# Patient Record
Sex: Male | Born: 1985 | Race: White | Hispanic: No | Marital: Married | Smoking: Never smoker
Health system: Southern US, Community
[De-identification: ages and names within clinical notes are randomized; demographics above are authoritative.]

## PROBLEM LIST (undated history)

## (undated) DIAGNOSIS — K219 Gastro-esophageal reflux disease without esophagitis: Secondary | ICD-10-CM

## (undated) HISTORY — PX: NO PAST SURGERIES: SHX2092

---
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
## 2016-07-09 ENCOUNTER — Other Ambulatory Visit: Payer: Self-pay | Admitting: Physical Medicine and Rehabilitation

## 2016-07-09 ENCOUNTER — Ambulatory Visit
Admission: RE | Admit: 2016-07-09 | Discharge: 2016-07-09 | Disposition: A | Discharge status: Home / Self Care | Payer: No Typology Code available for payment source | Source: Ambulatory Visit | Attending: Physical Medicine and Rehabilitation | Admitting: Physical Medicine and Rehabilitation

## 2016-07-09 DIAGNOSIS — Z0000 Encounter for general adult medical examination without abnormal findings: Secondary | ICD-10-CM

## 2018-02-23 ENCOUNTER — Other Ambulatory Visit: Payer: Self-pay

## 2018-02-23 ENCOUNTER — Ambulatory Visit (INDEPENDENT_AMBULATORY_CARE_PROVIDER_SITE_OTHER)
Admission: RE | Admit: 2018-02-23 | Discharge: 2018-02-23 | Disposition: A | Discharge status: Home / Self Care | Payer: Self-pay | Source: Ambulatory Visit | Attending: Acute Care | Admitting: Acute Care

## 2018-02-23 ENCOUNTER — Ambulatory Visit (INDEPENDENT_AMBULATORY_CARE_PROVIDER_SITE_OTHER): Payer: Self-pay | Admitting: Acute Care

## 2018-02-23 ENCOUNTER — Encounter: Payer: Self-pay | Admitting: Acute Care

## 2018-02-23 DIAGNOSIS — R05 Cough: Secondary | ICD-10-CM

## 2018-02-23 DIAGNOSIS — R059 Cough, unspecified: Secondary | ICD-10-CM

## 2018-02-23 DIAGNOSIS — J019 Acute sinusitis, unspecified: Secondary | ICD-10-CM | POA: Insufficient documentation

## 2018-02-23 MED ORDER — PREDNISONE 10 MG PO TABS: ORAL_TABLET | ORAL | 0 refills | Status: DC

## 2018-02-23 MED ORDER — AMOXICILLIN-POT CLAVULANATE 875-125 MG PO TABS: 1.0000 | ORAL_TABLET | Freq: Two times a day (BID) | ORAL | 0 refills | Status: DC

## 2018-02-23 MED ORDER — HYDROCODONE-HOMATROPINE 5-1.5 MG/5ML PO SYRP: 5.0000 mL | ORAL_SOLUTION | Freq: Four times a day (QID) | ORAL | 0 refills | Status: DC | PRN

## 2018-02-23 NOTE — Patient Instructions (Addendum)
CXR today Augmentin 875 mg po twice daily x 7 days Prednisone taper; 10 mg tablets: 4 tabs x 2 days, 3 tabs x 2 days, 2 tabs x 2 days 1 tab x 2 days then stop. Zyrtec each day for allergies Delsym every 12 hours for cough Sips of water instead of throat clearing Sugar free hard candies for throat soothing Consider Advil sinus for nasal congestion  Nasal saline mist as needed for nasal congestion. Mucinex 1200 mg daily with a full glass of water Hydromet 5 cc's at bedtime for rest. Don't drive if you are sleepy Follow up as needed. Please contact office for sooner follow up if symptoms do not improve or worsen or seek emergency care

## 2018-02-23 NOTE — Assessment & Plan Note (Signed)
Acute bronchitis / Sinusitis Plan: CXR today Augmentin 875 mg po twice daily x 7 days Prednisone taper; 10 mg tablets: 4 tabs x 2 days, 3 tabs x 2 days, 2 tabs x 2 days 1 tab x 2 days then stop. Zyrtec each day for allergies Delsym every 12 hours for cough Sips of water instead of throat clearing Sugar free hard candies for throat soothing Consider Advil sinus for nasal congestion  Nasal saline mist as needed for nasal congestion. Mucinex 1200 mg daily with a full glass of water Hydromet 5 cc's at bedtime for rest. Don't drive if you are sleepy Follow up as needed. Please contact office for sooner follow up if symptoms do not improve or worsen or seek emergency care

## 2018-02-23 NOTE — Progress Notes (Signed)
History of Present Illness Austin Bautista is a 32 y.o. male former smoker with no significant PMH . He is a new patient to the clinic, here for an acute OV for suspected pneumonia.   02/23/2018  Acute OV: Pt. Presents for  Acute visit. He has new nasal congestion and productive cough. He has been sick x 3 days. He denies any fever. He has chest tightness and has not complained of SOB. He has had walking pneumonia in the past, and states he feels like he did when he was diagnosed with walking pneumonia. Secretions are greenish brown. He also has nasal congestion that is green.He endorses post nasal gtt. 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   Test Results: CXR 02/22/2018>> Heart and mediastinal contours are within normal limits. No focal opacities or effusions. No acute bony abnormality.  No flowsheet data found.  No flowsheet data found.  BNP No results found for: BNP  ProBNP No results found for: PROBNP  PFT No results found for: FEV1PRE, FEV1POST, FVCPRE, FVCPOST, TLC, DLCOUNC, PREFEV1FVCRT, PSTFEV1FVCRT  Dg Chest 2 View  Result Date: 02/23/2018 CLINICAL DATA:  Cough, congestion, chest pain EXAM: CHEST - 2 VIEW COMPARISON:  07/09/2016 FINDINGS: Heart and mediastinal contours are within normal limits. No focal opacities or effusions. No acute bony abnormality. IMPRESSION: No active cardiopulmonary disease. Electronically Signed   By: Charlett NoseKevin  Dover M.D.   On: 02/23/2018 11:28     Past medical hx History reviewed. No pertinent past medical history.   Social History   Tobacco Use  . Smoking status: Never Smoker  . Smokeless tobacco: Current User    Types: Chew  Substance Use Topics  . Alcohol use: Not on file  . Drug use: Not on file    Mr.Clements reports that he has never smoked. His smokeless tobacco use includes chew.  Tobacco Cessation: Pt. Is an occasional chewing tobacco user.   Past surgical hx, Family hx, Social hx all  reviewed.  Current Outpatient Medications on File Prior to Visit  Medication Sig  . pantoprazole (PROTONIX) 40 MG tablet Take 40 mg by mouth daily.   No current facility-administered medications on file prior to visit.      Allergies  Allergen Reactions  . Minocycline Hives    Review Of Systems:  Constitutional:   No  weight loss, night sweats,  Fevers, chills, fatigue, or  lassitude.  HEENT:   No headaches,  Difficulty swallowing,  Tooth/dental problems, or  Sore throat,                No sneezing, itching, ear ache, + nasal congestion, + post nasal drip,   CV:  No chest pain,  Orthopnea, PND, swelling in lower extremities, anasarca, dizziness, palpitations, syncope.   GI  No heartburn, indigestion, abdominal pain, nausea, vomiting, diarrhea, change in bowel habits, loss of appetite, bloody stools.   Resp: No shortness of breath with exertion or at rest.  + excess mucus, + productive cough,  No non-productive cough,  No coughing up of blood.  + change in color of mucus.  No wheezing.  No chest wall deformity  Skin: no rash or lesions.  GU: no dysuria, change in color of urine, no urgency or frequency.  No flank pain, no hematuria   MS:  No joint pain or swelling.  No decreased range of motion.  No back pain.  Psych:  No change in mood or affect. No depression or anxiety.  No memory loss.   Vital Signs BP 122/88 (BP Location: Left Arm, Cuff Size: Normal)   Pulse 94   Wt 278 lb (126.1 kg)   SpO2 97%    Physical Exam:  General- No distress,  A&Ox3, pleasant ENT: No sinus tenderness, TM clear, edematous  nasal mucosa, no oral exudate,+ post nasal drip, no LAN Cardiac: S1, S2, regular rate and rhythm, no murmur Chest: No wheeze/ rales/ dullness; no accessory muscle use, no nasal flaring, no sternal retractions Abd.: Soft Non-tender, ND, BS + Ext: No clubbing cyanosis, edema Neuro:  normal strength, Cranial nerves intact Skin: No rashes, warm and dry Psych: normal mood  and behavior   Assessment/Plan  No problem-specific Assessment & Plan notes found for this encounter.    Bevelyn Ngo, NP 02/23/2018  1:46 PM

## 2018-12-24 ENCOUNTER — Other Ambulatory Visit: Payer: Self-pay

## 2018-12-24 ENCOUNTER — Ambulatory Visit (HOSPITAL_COMMUNITY)
Admission: EM | Admit: 2018-12-24 | Discharge: 2018-12-24 | Disposition: A | Discharge status: Home / Self Care | Payer: 59 | Attending: Family Medicine | Admitting: Family Medicine

## 2018-12-24 ENCOUNTER — Encounter (HOSPITAL_COMMUNITY): Payer: Self-pay | Admitting: Emergency Medicine

## 2018-12-24 ENCOUNTER — Ambulatory Visit (INDEPENDENT_AMBULATORY_CARE_PROVIDER_SITE_OTHER): Payer: 59

## 2018-12-24 DIAGNOSIS — J189 Pneumonia, unspecified organism: Secondary | ICD-10-CM

## 2018-12-24 DIAGNOSIS — R0602 Shortness of breath: Secondary | ICD-10-CM | POA: Diagnosis not present

## 2018-12-24 DIAGNOSIS — R05 Cough: Secondary | ICD-10-CM | POA: Diagnosis not present

## 2018-12-24 HISTORY — DX: Gastro-esophageal reflux disease without esophagitis: K21.9

## 2018-12-24 MED ORDER — CEFDINIR 300 MG PO CAPS: 300.0000 mg | ORAL_CAPSULE | Freq: Two times a day (BID) | ORAL | 0 refills | Status: DC

## 2018-12-24 MED ORDER — CEFTRIAXONE SODIUM 250 MG IJ SOLR
INTRAMUSCULAR | Status: AC
  Filled 2018-12-24: qty 250

## 2018-12-24 MED ORDER — HYDROCODONE-HOMATROPINE 5-1.5 MG/5ML PO SYRP: 5.0000 mL | ORAL_SOLUTION | Freq: Four times a day (QID) | ORAL | 0 refills | Status: DC | PRN

## 2018-12-24 MED ORDER — ACETAMINOPHEN 325 MG PO TABS
650.0000 mg | ORAL_TABLET | Freq: Once | ORAL | Status: AC
  Administered 2018-12-24: 650 mg via ORAL

## 2018-12-24 MED ORDER — ACETAMINOPHEN 325 MG PO TABS
ORAL_TABLET | ORAL | Status: AC
  Filled 2018-12-24: qty 2

## 2018-12-24 MED ORDER — CEFTRIAXONE SODIUM 250 MG IJ SOLR
250.0000 mg | Freq: Once | INTRAMUSCULAR | Status: AC
  Administered 2018-12-24: 250 mg via INTRAMUSCULAR

## 2018-12-24 NOTE — ED Provider Notes (Addendum)
[facility]    CSN: [id number] Arrival date & time: 12/24/18  1544     History   Chief Complaint Chief Complaint  Patient presents with  . Cough    HPI Austin RUMPEL is a 33 y.o. male.   Patient has a history of cough for 2 to 3 days with fever.  Cough is productive of yellow sputum.  Denies chills.  Denies myalgias sore throat headache HPI  Past Medical History:  Diagnosis Date  . GERD (gastroesophageal reflux disease)     Patient Active Problem List   Diagnosis Date Noted  . Sinusitis, acute 02/23/2018    History reviewed. No pertinent surgical history.     Home Medications    Prior to Admission medications   Medication Sig Start Date End Date Taking? Authorizing Provider  pantoprazole (PROTONIX) 40 MG tablet Take 40 mg by mouth daily.   Yes [provider]    Family History History reviewed. No pertinent family history.  Social History Social History   Tobacco Use  . Smoking status: Never Smoker  . Smokeless tobacco: Current User    Types: Chew  Substance Use Topics  . Alcohol use: Yes    Comment: Occ  . Drug use: Never     Allergies   Minocycline   Review of Systems Review of Systems  Constitutional: Positive for fever.  HENT: Positive for congestion and postnasal drip.   Respiratory: Positive for cough.   All other systems reviewed and are negative.    Physical Exam Triage Vital Signs ED Triage Vitals  Enc Vitals Group     BP 12/24/18 1627 132/63     Pulse Rate 12/24/18 1627 (!) 124     Resp 12/24/18 1627 18     Temp 12/24/18 1627 (!) 104.4 F (40.2 C)     Temp Source 12/24/18 1627 Temporal     SpO2 12/24/18 1627 95 %     Weight --      Height --      Head Circumference --      Peak Flow --      Pain Score 12/24/18 1626 8     Pain Loc --      Pain Edu? --      Excl. in GC? --    No data found.  Updated Vital Signs BP 132/63 (BP Location: Right Arm)   Pulse (!) 124   Temp (!) 104.4 F (40.2 C)  (Temporal)   Resp 18   SpO2 95%   Visual Acuity Right Eye Distance:   Left Eye Distance:   Bilateral Distance:    Right Eye Near:   Left Eye Near:    Bilateral Near:     Physical exam: HEENT -negative Chest lungs are clear to percussion and auscultation  Heart regular and tachycardic likely secondary to fever  Chest x-ray is consistent with pneumonia  UC Treatments / Results  Labs (all labs ordered are listed, but only abnormal results are displayed) Labs Reviewed - No data to display  EKG None  Radiology No results found.  Procedures Procedures (including critical care time)  Medications Ordered in UC Medications  acetaminophen (TYLENOL) tablet 650 mg (650 mg Oral Given 12/24/18 1637)    Initial Impression / Assessment and Plan / UC Course  I have reviewed the triage vital signs and the nursing notes.  Pertinent labs & imaging results that were available during my care of the patient were reviewed by me and considered in  my medical decision making (see chart for details).     Pneumonia per radiology over read.  Will give Rocephin here and follow-up with Omnicef Final Clinical Impressions(s) / UC Diagnoses   Final diagnoses:  None   Discharge Instructions   None    ED Prescriptions    None     Controlled Substance Prescriptions [place] Controlled Substance Registry consulted? Yes, I have consulted the [place] Controlled Substances Registry for this patient, and feel the risk/benefit ratio today is favorable for proceeding with this prescription for a controlled substance.   Frederica Kuster, MD 12/24/18 1747    Frederica Kuster, MD 12/24/18 [phone]

## 2018-12-24 NOTE — ED Triage Notes (Signed)
The patient presented to the [facility] with a complaint of a cough and fever x 3 days.

## 2018-12-25 ENCOUNTER — Other Ambulatory Visit: Payer: Self-pay | Admitting: Acute Care

## 2018-12-25 DIAGNOSIS — J440 Chronic obstructive pulmonary disease with acute lower respiratory infection: Principal | ICD-10-CM

## 2018-12-25 DIAGNOSIS — J209 Acute bronchitis, unspecified: Secondary | ICD-10-CM

## 2018-12-25 MED ORDER — HYDROCODONE-HOMATROPINE 5-1.5 MG/5ML PO SYRP: 5.0000 mL | ORAL_SOLUTION | Freq: Four times a day (QID) | ORAL | 0 refills | Status: DC | PRN

## 2019-08-08 ENCOUNTER — Other Ambulatory Visit: Payer: Self-pay

## 2019-08-08 ENCOUNTER — Encounter (HOSPITAL_COMMUNITY): Payer: Self-pay

## 2019-08-08 ENCOUNTER — Ambulatory Visit (HOSPITAL_COMMUNITY)
Admission: EM | Admit: 2019-08-08 | Discharge: 2019-08-08 | Disposition: A | Discharge status: Home / Self Care | Payer: 59 | Attending: Internal Medicine | Admitting: Internal Medicine

## 2019-08-08 DIAGNOSIS — R000 Tachycardia, unspecified: Secondary | ICD-10-CM | POA: Diagnosis not present

## 2019-08-08 DIAGNOSIS — Z20828 Contact with and (suspected) exposure to other viral communicable diseases: Secondary | ICD-10-CM | POA: Diagnosis not present

## 2019-08-08 DIAGNOSIS — J22 Unspecified acute lower respiratory infection: Secondary | ICD-10-CM | POA: Diagnosis not present

## 2019-08-08 DIAGNOSIS — R062 Wheezing: Secondary | ICD-10-CM | POA: Diagnosis not present

## 2019-08-08 MED ORDER — BENZONATATE 100 MG PO CAPS: 100.0000 mg | ORAL_CAPSULE | Freq: Three times a day (TID) | ORAL | 0 refills | Status: DC

## 2019-08-08 MED ORDER — AMOXICILLIN-POT CLAVULANATE 875-125 MG PO TABS: 1.0000 | ORAL_TABLET | Freq: Two times a day (BID) | ORAL | 0 refills | Status: DC

## 2019-08-08 MED ORDER — HYDROCODONE-HOMATROPINE 5-1.5 MG/5ML PO SYRP: 5.0000 mL | ORAL_SOLUTION | Freq: Four times a day (QID) | ORAL | 0 refills | Status: DC | PRN

## 2019-08-08 MED ORDER — ALBUTEROL SULFATE HFA 108 (90 BASE) MCG/ACT IN AERS: 1.0000 | INHALATION_SPRAY | Freq: Four times a day (QID) | RESPIRATORY_TRACT | 0 refills | Status: DC | PRN

## 2019-08-08 NOTE — ED Provider Notes (Signed)
[facility]    CSN: [id number] Arrival date & time: 08/08/19  [id number]      History   Chief Complaint Chief Complaint  Patient presents with   Cough    HPI Austin Bautista is a 33 y.o. male.   Patient is a 33 year old male past medical history of GERD, sinusitis.  He presents today with productive cough worsening over the last 2 weeks.  The cough is been keeping him up at night.  He has had low-grade fever.  Temperature here today 100.3.  No known COVID exposures.  He has been using over-the-counter Mucinex, NyQuil and DayQuil for symptoms.  Denies any chest pain, shortness of breath, nasal congestion, rhinorrhea, sore throat or ear pain.  No known COVID exposures.  ROS per HPI      Past Medical History:  Diagnosis Date   GERD (gastroesophageal reflux disease)     Patient Active Problem List   Diagnosis Date Noted   Sinusitis, acute 02/23/2018    History reviewed. No pertinent surgical history.     Home Medications    Prior to Admission medications   Medication Sig Start Date End Date Taking? Authorizing Provider  albuterol (VENTOLIN HFA) 108 (90 Base) MCG/ACT inhaler Inhale 1-2 puffs into the lungs every 6 (six) hours as needed for wheezing or shortness of breath. 08/08/19   Dahlia ByesBast, Bethel Gaglio A, NP  amoxicillin-clavulanate (AUGMENTIN) 875-125 MG tablet Take 1 tablet by mouth every 12 (twelve) hours. 08/08/19   Tashanti Dalporto, Gloris Manchesterraci A, NP  benzonatate (TESSALON) 100 MG capsule Take 1 capsule (100 mg total) by mouth every 8 (eight) hours. 08/08/19   Atlas Crossland, Gloris Manchesterraci A, NP  HYDROcodone-homatropine (HYCODAN) 5-1.5 MG/5ML syrup Take 5 mLs by mouth every 6 (six) hours as needed for cough. 08/08/19   Myson Levi, Gloris Manchesterraci A, NP  pantoprazole (PROTONIX) 40 MG tablet Take 40 mg by mouth daily.    [provider]    Family History History reviewed. No pertinent family history.  Social History Social History   Tobacco Use   Smoking status: Never Smoker   Smokeless tobacco: Current User     Types: Chew  Substance Use Topics   Alcohol use: Yes    Comment: Occ   Drug use: Never     Allergies   Minocycline   Review of Systems Review of Systems   Physical Exam Triage Vital Signs ED Triage Vitals [08/08/19 0825]  Enc Vitals Group     BP 134/84     Pulse Rate (!) 107     Resp 18     Temp 100.3 F (37.9 C)     Temp Source Temporal     SpO2 97 %     Weight 270 lb (122.5 kg)     Height      Head Circumference      Peak Flow      Pain Score 8     Pain Loc      Pain Edu?      Excl. in GC?    No data found.  Updated Vital Signs BP 134/84    Pulse (!) 107    Temp 100.3 F (37.9 C) (Temporal)    Resp 18    Wt 270 lb (122.5 kg)    SpO2 97%   Visual Acuity Right Eye Distance:   Left Eye Distance:   Bilateral Distance:    Right Eye Near:   Left Eye Near:    Bilateral Near:     Physical Exam  Vitals signs and nursing note reviewed.  Constitutional:      General: He is not in acute distress.    Appearance: Normal appearance. He is not ill-appearing, toxic-appearing or diaphoretic.  HENT:     Head: Normocephalic and atraumatic.     Right Ear: Tympanic membrane and ear canal normal.     Left Ear: Tympanic membrane and ear canal normal.     Nose: Nose normal.     Mouth/Throat:     Pharynx: Oropharynx is clear.  Eyes:     Conjunctiva/sclera: Conjunctivae normal.  Neck:     Musculoskeletal: Normal range of motion.  Cardiovascular:     Rate and Rhythm: Tachycardia present.     Heart sounds: Normal heart sounds.  Pulmonary:     Effort: Pulmonary effort is normal.     Breath sounds: Wheezing and rales present. No rhonchi.  Musculoskeletal: Normal range of motion.  Skin:    General: Skin is warm and dry.  Neurological:     Mental Status: He is alert.  Psychiatric:        Mood and Affect: Mood normal.      UC Treatments / Results  Labs (all labs ordered are listed, but only abnormal results are displayed) Labs Reviewed  NOVEL CORONAVIRUS,  NAA (HOSP ORDER, SEND-OUT TO REF LAB; TAT 18-24 HRS)    EKG   Radiology No results found.  Procedures Procedures (including critical care time)  Medications Ordered in UC Medications - No data to display  Initial Impression / Assessment and Plan / UC Course  I have reviewed the triage vital signs and the nursing notes.  Pertinent labs & imaging results that were available during my care of the patient were reviewed by me and considered in my medical decision making (see chart for details).     Lower respiratory tract infection-treating with Augmentin twice a day for 7 days for infection.  Albuterol inhaler to use as needed for cough, wheezing or shortness of breath Tessalon Perles for cough during the day and hydrocodone cough syrup for night COVID test pending Final Clinical Impressions(s) / UC Diagnoses   Final diagnoses:  Lower respiratory tract infection     Discharge Instructions     Treating you for pneumonia Take medication as prescribed. Your COVID test is pending and we will call with any positive results Follow up as needed for continued or worsening symptoms     ED Prescriptions    Medication Sig Dispense Auth. Provider   amoxicillin-clavulanate (AUGMENTIN) 875-125 MG tablet Take 1 tablet by mouth every 12 (twelve) hours. 14 tablet Daniell Mancinas A, NP   HYDROcodone-homatropine (HYCODAN) 5-1.5 MG/5ML syrup Take 5 mLs by mouth every 6 (six) hours as needed for cough. 120 mL Braeden Kennan A, NP   benzonatate (TESSALON) 100 MG capsule Take 1 capsule (100 mg total) by mouth every 8 (eight) hours. 21 capsule Takeru Bose A, NP   albuterol (VENTOLIN HFA) 108 (90 Base) MCG/ACT inhaler Inhale 1-2 puffs into the lungs every 6 (six) hours as needed for wheezing or shortness of breath. 18 g Loura Halt A, NP     Controlled Substance Prescriptions [place] Controlled Substance Registry consulted? Not Applicable   Orvan July, NP 08/08/19 [phone]

## 2019-08-08 NOTE — ED Triage Notes (Signed)
Pt states he has a deep cough. X 2 weeks

## 2019-08-08 NOTE — Discharge Instructions (Addendum)
Treating you for pneumonia Take medication as prescribed. Your COVID test is pending and we will call with any positive results Follow up as needed for continued or worsening symptoms

## 2019-08-10 LAB — NOVEL CORONAVIRUS, NAA (HOSP ORDER, SEND-OUT TO REF LAB; TAT 18-24 HRS): SARS-CoV-2, NAA: NOT DETECTED

## 2019-08-13 ENCOUNTER — Encounter (HOSPITAL_COMMUNITY): Payer: Self-pay

## 2020-04-01 ENCOUNTER — Ambulatory Visit: Payer: 59 | Admitting: Adult Health

## 2021-03-02 ENCOUNTER — Ambulatory Visit: Payer: 59 | Admitting: Emergency Medicine

## 2021-03-02 ENCOUNTER — Encounter: Payer: Self-pay | Admitting: Emergency Medicine

## 2021-03-02 ENCOUNTER — Other Ambulatory Visit: Payer: Self-pay

## 2021-03-02 DIAGNOSIS — G4733 Obstructive sleep apnea (adult) (pediatric): Secondary | ICD-10-CM | POA: Insufficient documentation

## 2021-03-02 DIAGNOSIS — G4719 Other hypersomnia: Secondary | ICD-10-CM | POA: Diagnosis not present

## 2021-03-02 NOTE — Assessment & Plan Note (Signed)
He almost certainly has obstructive sleep apnea based on history.  He needs a sleep study, treatment that I would like to at least begin with CPAP.  I can arrange for an auto titration device.  He has a beard so we could start with nasal pillows.  He will probably need a chinstrap because he sleeps with his mouth open.  We briefly discussed other possible treatments including dental device, surgery, hypoglossal nerve stimulator.  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  We will have to pursue this further once we have the obstructive sleep apnea managed.

## 2021-03-02 NOTE — Progress Notes (Signed)
Subjective:    Patient ID: Austin Bautista, male    DOB: 1986/10/13, 35 y.o.   MRN: [id number]  HPI Austin Bautista is a pleasant 35 year old gentleman with a minimal prior tobacco history and little other PMH except for GERD.   He is here today to discuss probable OSA. He falls asleep easily, even unintentionally at work. Decreased energy and a lot of daily fatigue. He works swing shifts and has a lot of sleep / shift variability through work.  His wife has heard snoring and has seen apneas.  He has put on ~100 lbs over 10 yrs.  Epworth = 16   Review of Systems As per HPi  Past Medical History:  Diagnosis Date  . GERD (gastroesophageal reflux disease)      No family history on file.   Social History   Socioeconomic History  . Marital status: Married    Spouse name: Not on file  . Number of children: Not on file  . Years of education: Not on file  . Highest education level: Not on file  Occupational History  . Not on file  Tobacco Use  . Smoking status: Never Smoker  . Smokeless tobacco: Current User    Types: Chew  Vaping Use  . Vaping Use: Former  Substance and Sexual Activity  . Alcohol use: Yes    Comment: Occ  . Drug use: Never  . Sexual activity: Not on file  Other Topics Concern  . Not on file  Social History Narrative  . Not on file   Social Determinants of Health   Financial Resource Strain: Not on file  Food Insecurity: Not on file  Transportation Needs: Not on file  Physical Activity: Not on file  Stress: Not on file  Social Connections: Not on file  Intimate Partner Violence: Not on file    Works at [employer].  No inhaled exposures.    Allergies  Allergen Reactions  . Minocycline Hives     Outpatient Medications Prior to Visit  Medication Sig Dispense Refill  . pantoprazole (PROTONIX) 40 MG tablet Take 40 mg by mouth daily.    Marland Kitchen albuterol (VENTOLIN HFA) 108 (90 Base) MCG/ACT inhaler Inhale 1-2 puffs into the lungs every 6 (six) hours as needed for  wheezing or shortness of breath. 18 g 0  . benzonatate (TESSALON) 100 MG capsule Take 1 capsule (100 mg total) by mouth every 8 (eight) hours. 21 capsule 0  . amoxicillin-clavulanate (AUGMENTIN) 875-125 MG tablet Take 1 tablet by mouth every 12 (twelve) hours. 14 tablet 0  . HYDROcodone-homatropine (HYCODAN) 5-1.5 MG/5ML syrup Take 5 mLs by mouth every 6 (six) hours as needed for cough. 120 mL 0   No facility-administered medications prior to visit.         Objective:   Physical Exam  Vitals:   03/02/21 1525  BP: 124/78  Pulse: 91  SpO2: 97%   Gen: Pleasant, well-nourished, in no distress,  normal affect  ENT: No lesions,  mouth clear,  oropharynx clear, no postnasal drip  Neck: No JVD, no stridor  Lungs: No use of accessory muscles, no crackles or wheezing on normal respiration, no wheeze on forced expiration  Cardiovascular: RRR, heart sounds normal, no murmur or gallops, no peripheral edema  Musculoskeletal: No deformities, no cyanosis or clubbing  Neuro: alert, awake, non focal  Skin: Warm, no lesions or rash     Assessment & Plan:  Excessive daytime sleepiness He almost certainly has obstructive sleep apnea based on  history.  He needs a sleep study, treatment that I would like to at least begin with CPAP.  I can arrange for an auto titration device.  He has a beard so we could start with nasal pillows.  He will probably need a chinstrap because he sleeps with his mouth open.  We briefly discussed other possible treatments including dental device, surgery, hypoglossal nerve stimulator.  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  We will have to pursue this further once we have the obstructive sleep apnea managed.   Levy Pupa, MD, PhD 03/02/2021, 3:58 PM [facility] [phone] or if no answer before 7:00PM call [phone] For any issues after 7:00PM please call eLink  [phone]

## 2021-03-02 NOTE — Addendum Note (Signed)
Addended by: Dorisann Frames R on: 03/02/2021 04:08 PM   Modules accepted: Orders

## 2021-03-02 NOTE — Patient Instructions (Signed)
We will arrange for a home sleep study Depending on your sleep study results we will likely arrange for an auto titration CPAP machine. Follow with Dr. Delton Coombes in 2 to 3 months so that we can review your progress on CPAP.

## 2021-03-06 ENCOUNTER — Ambulatory Visit: Payer: 59

## 2021-03-06 ENCOUNTER — Other Ambulatory Visit: Payer: Self-pay

## 2021-03-06 DIAGNOSIS — G4719 Other hypersomnia: Secondary | ICD-10-CM

## 2021-03-06 DIAGNOSIS — G4733 Obstructive sleep apnea (adult) (pediatric): Secondary | ICD-10-CM | POA: Diagnosis not present

## 2021-03-10 DIAGNOSIS — G4733 Obstructive sleep apnea (adult) (pediatric): Secondary | ICD-10-CM

## 2021-03-23 ENCOUNTER — Other Ambulatory Visit: Payer: Self-pay | Admitting: Emergency Medicine

## 2021-03-23 DIAGNOSIS — G4733 Obstructive sleep apnea (adult) (pediatric): Secondary | ICD-10-CM

## 2021-03-23 NOTE — Progress Notes (Signed)
Patient's home sleep study reviewed >> shows severe OSA, AHI 63/ h ( 78/h in REM) associated with desaturations.   Will order auto-set CPAp 10-20 cm H2O. May need home ONO after CPAP established to insure no supplemental O2 needed to be bled in.

## 2021-05-26 ENCOUNTER — Other Ambulatory Visit: Payer: Self-pay

## 2021-05-26 ENCOUNTER — Ambulatory Visit
Admission: RE | Admit: 2021-05-26 | Discharge: 2021-05-26 | Disposition: A | Discharge status: Home / Self Care | Payer: 59 | Source: Ambulatory Visit | Attending: Emergency Medicine | Admitting: Emergency Medicine

## 2021-05-26 ENCOUNTER — Ambulatory Visit (INDEPENDENT_AMBULATORY_CARE_PROVIDER_SITE_OTHER): Payer: 59

## 2021-05-26 VITALS — BP 136/93 | HR 92 | Temp 98.3°F | Resp 20

## 2021-05-26 DIAGNOSIS — Z1152 Encounter for screening for COVID-19: Secondary | ICD-10-CM

## 2021-05-26 DIAGNOSIS — R059 Cough, unspecified: Secondary | ICD-10-CM

## 2021-05-26 DIAGNOSIS — J209 Acute bronchitis, unspecified: Secondary | ICD-10-CM

## 2021-05-26 MED ORDER — AZITHROMYCIN 250 MG PO TABS: 250.0000 mg | ORAL_TABLET | Freq: Every day | ORAL | 0 refills | Status: DC

## 2021-05-26 MED ORDER — BENZONATATE 100 MG PO CAPS: 100.0000 mg | ORAL_CAPSULE | Freq: Three times a day (TID) | ORAL | 0 refills | Status: DC | PRN

## 2021-05-26 NOTE — ED Triage Notes (Addendum)
Pt presents today with c/o of productive cough x 5 days. Denies fever.   He has taken two home Covid test, both negative, last 6/26.

## 2021-05-26 NOTE — Discharge Instructions (Addendum)
Take the Zithromax as directed.  Take the Tessalon as needed for cough.  Schedule a follow-up appointment with your PCP in 2 weeks for a recheck of your lungs and to discuss the x-ray findings.  Sooner if your symptoms are not improving.

## 2021-05-26 NOTE — ED Provider Notes (Signed)
Renaldo Fiddler    CSN: [id number] Arrival date & time: 05/26/21  1238      History   Chief Complaint Chief Complaint  Patient presents with   Cough    HPI Austin Bautista is a 35 y.o. male.  Patient presents with productive cough and congestion x 5 days.  He states it has gotten worse in the last 2 days.  He denies fever, chills, sore throat, shortness of breath, vomiting, diarrhea, or other symptoms.  He reports history of pneumonia 2 years ago.  Treatment at home with Mucinex, DayQuil, NyQuil.  He also reports negative COVID test at home.  The history is provided by the patient and medical records.   Past Medical History:  Diagnosis Date   GERD (gastroesophageal reflux disease)     Patient Active Problem List   Diagnosis Date Noted   Excessive daytime sleepiness 03/02/2021   Sinusitis, acute 02/23/2018    History reviewed. No pertinent surgical history.     Home Medications    Prior to Admission medications   Medication Sig Start Date End Date Taking? Authorizing Provider  azithromycin (ZITHROMAX) 250 MG tablet Take 1 tablet (250 mg total) by mouth daily. Take first 2 tablets together, then 1 every day until finished. 05/26/21  Yes Mickie Bail, NP  benzonatate (TESSALON) 100 MG capsule Take 1 capsule (100 mg total) by mouth 3 (three) times daily as needed for cough. 05/26/21  Yes Mickie Bail, NP  albuterol (VENTOLIN HFA) 108 (90 Base) MCG/ACT inhaler Inhale 1-2 puffs into the lungs every 6 (six) hours as needed for wheezing or shortness of breath. 08/08/19   Bast, Gloris Manchester A, NP  pantoprazole (PROTONIX) 40 MG tablet Take 40 mg by mouth daily.    [provider]    Family History Family History  Problem Relation Age of Onset   Healthy Mother    Healthy Father     Social History Social History   Tobacco Use   Smoking status: Never   Smokeless tobacco: Current    Types: Chew  Vaping Use   Vaping Use: Former  Substance Use Topics   Alcohol  use: Yes    Comment: Occ   Drug use: Never     Allergies   Minocycline   Review of Systems Review of Systems  Constitutional:  Negative for chills and fever.  HENT:  Positive for congestion. Negative for ear pain and sore throat.   Respiratory:  Positive for cough. Negative for shortness of breath.   Cardiovascular:  Negative for chest pain and palpitations.  Gastrointestinal:  Negative for abdominal pain and vomiting.  Skin:  Negative for color change and rash.  All other systems reviewed and are negative.   Physical Exam Triage Vital Signs ED Triage Vitals  Enc Vitals Group     BP      Pulse      Resp      Temp      Temp src      SpO2      Weight      Height      Head Circumference      Peak Flow      Pain Score      Pain Loc      Pain Edu?      Excl. in GC?    No data found.  Updated Vital Signs BP (!) 136/93 (BP Location: Right Arm)   Pulse 92   Temp 98.3 F (36.8  C) (Oral)   Resp 20   SpO2 94%   Visual Acuity Right Eye Distance:   Left Eye Distance:   Bilateral Distance:    Right Eye Near:   Left Eye Near:    Bilateral Near:     Physical Exam Vitals and nursing note reviewed.  Constitutional:      General: He is not in acute distress.    Appearance: He is well-developed.  HENT:     Head: Normocephalic and atraumatic.     Right Ear: Tympanic membrane normal.     Left Ear: Tympanic membrane normal.     Nose: Nose normal.     Mouth/Throat:     Mouth: Mucous membranes are moist.     Pharynx: Oropharynx is clear.  Eyes:     Conjunctiva/sclera: Conjunctivae normal.  Cardiovascular:     Rate and Rhythm: Normal rate and regular rhythm.     Heart sounds: Normal heart sounds.  Pulmonary:     Effort: Pulmonary effort is normal. No respiratory distress.     Breath sounds: Normal breath sounds.  Abdominal:     Palpations: Abdomen is soft.     Tenderness: There is no abdominal tenderness.  Musculoskeletal:     Cervical back: Neck supple.   Skin:    General: Skin is warm and dry.  Neurological:     General: No focal deficit present.     Mental Status: He is alert and oriented to person, place, and time.     Gait: Gait normal.  Psychiatric:        Mood and Affect: Mood normal.        Behavior: Behavior normal.     UC Treatments / Results  Labs (all labs ordered are listed, but only abnormal results are displayed) Labs Reviewed  NOVEL CORONAVIRUS, NAA    EKG   Radiology DG Chest 2 View  Result Date: 05/26/2021 CLINICAL DATA:  Cough EXAM: CHEST - 2 VIEW COMPARISON:  December 24, 2018 FINDINGS: There is a degree of interstitial thickening. No edema or airspace opacity. Heart size and pulmonary vascularity are normal. No adenopathy. No bone lesions. IMPRESSION: Interstitial thickening, likely indicating a degree of chronic bronchitis. No edema or airspace opacity. Cardiac silhouette within normal limits there is Electronically Signed   By: Bretta Bang III M.D.   On: 05/26/2021 13:51    Procedures Procedures (including critical care time)  Medications Ordered in UC Medications - No data to display  Initial Impression / Assessment and Plan / UC Course  I have reviewed the triage vital signs and the nursing notes.  Pertinent labs & imaging results that were available during my care of the patient were reviewed by me and considered in my medical decision making (see chart for details).  Acute bronchitis.  Chest x-ray shows "interstitial thickening, likely indicating a degree of chronic bronchitis."  Treating symptoms today with Zithromax and Tessalon Perles.  Instructed patient to follow-up with his PCP in 2 weeks for recheck and to discuss the chronic bronchitis noted on the chest x-ray.  Instructed him to follow-up sooner if his symptoms are not improving.  He agrees to plan of care.   Final Clinical Impressions(s) / UC Diagnoses   Final diagnoses:  Encounter for screening for COVID-19  Acute bronchitis,  unspecified organism     Discharge Instructions      Take the Zithromax as directed.  Take the Tessalon as needed for cough.  Schedule a follow-up appointment with your PCP  in 2 weeks for a recheck of your lungs and to discuss the x-ray findings.  Sooner if your symptoms are not improving.     ED Prescriptions     Medication Sig Dispense Auth. Provider   azithromycin (ZITHROMAX) 250 MG tablet Take 1 tablet (250 mg total) by mouth daily. Take first 2 tablets together, then 1 every day until finished. 6 tablet Mickie Bail, NP   benzonatate (TESSALON) 100 MG capsule Take 1 capsule (100 mg total) by mouth 3 (three) times daily as needed for cough. 21 capsule Mickie Bail, NP      PDMP not reviewed this encounter.   Mickie Bail, NP 05/26/21 1414

## 2021-05-27 LAB — SARS-COV-2, NAA 2 DAY TAT

## 2021-05-27 LAB — NOVEL CORONAVIRUS, NAA: SARS-CoV-2, NAA: NOT DETECTED

## 2021-07-03 ENCOUNTER — Encounter: Payer: Self-pay | Admitting: Emergency Medicine

## 2021-07-03 ENCOUNTER — Ambulatory Visit: Payer: 59 | Admitting: Emergency Medicine

## 2021-07-03 ENCOUNTER — Other Ambulatory Visit: Payer: Self-pay

## 2021-07-03 DIAGNOSIS — G4733 Obstructive sleep apnea (adult) (pediatric): Secondary | ICD-10-CM

## 2021-07-03 NOTE — Patient Instructions (Signed)
We confirmed today good compliance with your CPAP system and good clinical response. Keep up the good work, continue use your CPAP whenever sleeping Follow with Dr. Delton Coombes in 1 year or sooner if you have any problems

## 2021-07-03 NOTE — Assessment & Plan Note (Signed)
Good clinical response to initiation of CPAP 10-20 cm water.  Documented good compliance.  He also has shift worker sleep issues but well managed.  Plan to continue same and follow annually or if he has any difficulty.

## 2021-07-03 NOTE — Progress Notes (Signed)
Subjective:    Patient ID: Austin Bautista, male    DOB: [birth year], 35 y.o.   MRN: [id number]  HPI Austin Bautista is a pleasant 35 year old gentleman with a minimal prior tobacco history and little other PMH except for GERD.   He is here today to discuss probable OSA. He falls asleep easily, even unintentionally at work. Decreased energy and a lot of daily fatigue. He works swing shifts and has a lot of sleep / shift variability through work.  His wife has heard snoring and has seen apneas.  He has put on ~100 lbs over 10 yrs.  Epworth = 16  ROV 07/03/21 --35 year old gentleman who started CPAP for significant obstructive sleep apnea.  He is on an AutoSet device 10-20 cm water.  He reports that since he started his wakefulness has improved, he is not snoring, he does not fall asleep unintentionally watching TV.  Better energy level and less napping.  Great clinical response.  A review of his compliance data shows good usage, over 93% of the days.  He actually has used it reliably every day but there is a lag due to shift worker phenomenon.  He has all of his supplies and is able to get these without trouble.    Review of Systems As per HPi  Past Medical History:  Diagnosis Date   GERD (gastroesophageal reflux disease)      Family History  Problem Relation Age of Onset   Healthy Mother    Healthy Father      Social History   Socioeconomic History   Marital status: Married    Spouse name: Not on file   Number of children: Not on file   Years of education: Not on file   Highest education level: Not on file  Occupational History   Not on file  Tobacco Use   Smoking status: Never   Smokeless tobacco: Current    Types: Chew  Vaping Use   Vaping Use: Former  Substance and Sexual Activity   Alcohol use: Yes    Comment: Occ   Drug use: Never   Sexual activity: Not on file  Other Topics Concern   Not on file  Social History Narrative   Not on file   Social Determinants of Health    Financial Resource Strain: Not on file  Food Insecurity: Not on file  Transportation Needs: Not on file  Physical Activity: Not on file  Stress: Not on file  Social Connections: Not on file  Intimate Partner Violence: Not on file    Works at [employer].  No inhaled exposures.    Allergies  Allergen Reactions   Minocycline Hives     Outpatient Medications Prior to Visit  Medication Sig Dispense Refill   pantoprazole (PROTONIX) 40 MG tablet Take 40 mg by mouth daily.     albuterol (VENTOLIN HFA) 108 (90 Base) MCG/ACT inhaler Inhale 1-2 puffs into the lungs every 6 (six) hours as needed for wheezing or shortness of breath. 18 g 0   azithromycin (ZITHROMAX) 250 MG tablet Take 1 tablet (250 mg total) by mouth daily. Take first 2 tablets together, then 1 every day until finished. 6 tablet 0   benzonatate (TESSALON) 100 MG capsule Take 1 capsule (100 mg total) by mouth 3 (three) times daily as needed for cough. 21 capsule 0   No facility-administered medications prior to visit.         Objective:   Physical Exam  Vitals:   07/03/21 1549  BP: 122/78  Pulse: 82  Temp: 98 F (36.7 C)  TempSrc: Oral  SpO2: 95%  Weight: (!) 321 lb 12.8 oz (146 kg)  Height: 6\' 3"  (1.905 m)   Gen: Pleasant, well-nourished, in no distress,  normal affect  ENT: No lesions,  mouth clear,  oropharynx clear, no postnasal drip  Neck: No JVD, no stridor  Lungs: No use of accessory muscles, no crackles or wheezing on normal respiration, no wheeze on forced expiration  Cardiovascular: RRR, heart sounds normal, no murmur or gallops, no peripheral edema  Musculoskeletal: No deformities, no cyanosis or clubbing  Neuro: alert, awake, non focal  Skin: Warm, no lesions or rash     Assessment & Plan:  Obstructive sleep apnea Good clinical response to initiation of CPAP 10-20 cm water.  Documented good compliance.  He also has shift worker sleep issues but well managed.  Plan to continue same and follow  annually or if he has any difficulty.   , MD, PhD 07/03/2021, 4:09 PM [facility] [phone] or if no answer before 7:00PM call [phone] For any issues after 7:00PM please call eLink [phone]

## 2021-12-19 ENCOUNTER — Ambulatory Visit (INDEPENDENT_AMBULATORY_CARE_PROVIDER_SITE_OTHER): Payer: 59

## 2021-12-19 ENCOUNTER — Ambulatory Visit
Admission: RE | Admit: 2021-12-19 | Discharge: 2021-12-19 | Disposition: A | Discharge status: Home / Self Care | Payer: 59 | Source: Ambulatory Visit

## 2021-12-19 ENCOUNTER — Other Ambulatory Visit: Payer: Self-pay

## 2021-12-19 VITALS — BP 137/89 | HR 86 | Temp 98.4°F | Resp 18

## 2021-12-19 DIAGNOSIS — M79671 Pain in right foot: Secondary | ICD-10-CM

## 2021-12-19 DIAGNOSIS — M109 Gout, unspecified: Secondary | ICD-10-CM | POA: Diagnosis not present

## 2021-12-19 MED ORDER — PREDNISONE 20 MG PO TABS: 20.0000 mg | ORAL_TABLET | Freq: Every day | ORAL | 0 refills | Status: DC

## 2021-12-19 MED ORDER — INDOMETHACIN 50 MG PO CAPS: 50.0000 mg | ORAL_CAPSULE | Freq: Two times a day (BID) | ORAL | 0 refills | Status: AC

## 2021-12-19 MED ORDER — KETOROLAC TROMETHAMINE 30 MG/ML IJ SOLN
30.0000 mg | Freq: Once | INTRAMUSCULAR | Status: AC
  Administered 2021-12-19: 30 mg via INTRAMUSCULAR

## 2021-12-19 NOTE — ED Triage Notes (Addendum)
Pt presents with right foot pain x 6 days. The pain started at his big toe and has moved to his ankle pain. Pt had an e-visit and prescribed prednisone on Tuesday it has not helped.

## 2021-12-19 NOTE — ED Provider Notes (Signed)
[name]    CSN: [id number] Arrival date & time: 12/19/21  1226      History   Chief Complaint Chief Complaint  Patient presents with   Foot Pain    HPI Austin Bautista is a 36 y.o. male.   HPI Patient presents today with right foot pain which originated in the great toe initially 6 days ago and has since progressed to involve the entire foot.  He reports that there is severe pain with weightbearing.  His entire foot is swollen, warm to touch, and erythematous.  The pain involves all of his toes the lateral and top of the foot.  He is not having any pain in his ankles however his ankles are swollen.  He denies any injury.  He reports he simply awakened with the symptoms 6 days ago.  He did an E-visit which she completed 5 days of prednisone taken his last dose yesterday.  He reports several years back he had an episode of gout involving his hand which required an extended prednisone taper along with indomethacin to resolve.  He denies any fever.  He has no known history of diabetes.  His left foot is unaffected.   Past Medical History:  Diagnosis Date   GERD (gastroesophageal reflux disease)     Patient Active Problem List   Diagnosis Date Noted   Obstructive sleep apnea 03/02/2021   Sinusitis, acute 02/23/2018    History reviewed. No pertinent surgical history.     Home Medications    Prior to Admission medications   Medication Sig Start Date End Date Taking? Authorizing Provider  indomethacin (INDOCIN) 50 MG capsule Take 1 capsule (50 mg total) by mouth 2 (two) times daily with a meal for 10 days. 12/19/21 12/29/21 Yes Bing Neighbors, FNP  pantoprazole (PROTONIX) 40 MG tablet Take 1 tablet by mouth daily. 12/10/21  Yes [provider]  predniSONE (DELTASONE) 20 MG tablet Take 1 tablet (20 mg total) by mouth daily with breakfast. Prednisone 20 mg, in mornings with breakfast as follows: Take 3 pills for 3 days, take 1 pills for 3 days, take 1/2 pill  for 3 days. 12/19/21   Bing Neighbors, FNP    Family History Family History  Problem Relation Age of Onset   Healthy Mother    Healthy Father     Social History Social History   Tobacco Use   Smoking status: Never   Smokeless tobacco: Current    Types: Chew  Vaping Use   Vaping Use: Former  Substance Use Topics   Alcohol use: Yes    Comment: Occ   Drug use: Never     Allergies   Minocycline   Review of Systems Review of Systems Pertinent negatives listed in HPI  Physical Exam Triage Vital Signs ED Triage Vitals  Enc Vitals Group     BP 12/19/21 1300 137/89     Pulse Rate 12/19/21 1300 86     Resp 12/19/21 1300 18     Temp 12/19/21 1300 98.4 F (36.9 C)     Temp Source 12/19/21 1300 Oral     SpO2 12/19/21 1300 95 %     Weight --      Height --      Head Circumference --      Peak Flow --      Pain Score 12/19/21 1258 8     Pain Loc --      Pain Edu? --  Excl. in GC? --    No data found.  Updated Vital Signs BP 137/89 (BP Location: Right Arm)    Pulse 86    Temp 98.4 F (36.9 C) (Oral)    Resp 18    SpO2 95%   Visual Acuity Right Eye Distance:   Left Eye Distance:   Bilateral Distance:    Right Eye Near:   Left Eye Near:    Bilateral Near:     Physical Exam Constitutional:      General: He is not in acute distress.    Appearance: He is obese. He is not ill-appearing.  HENT:     Head: Normocephalic and atraumatic.  Eyes:     Extraocular Movements: Extraocular movements intact.     Pupils: Pupils are equal, round, and reactive to light.  Cardiovascular:     Rate and Rhythm: Normal rate and regular rhythm.  Pulmonary:     Effort: Pulmonary effort is normal.     Breath sounds: Normal breath sounds and air entry.  Musculoskeletal:     Right foot: Decreased range of motion. Normal capillary refill. Swelling, tenderness and bony tenderness present. Normal pulse.     Comments: Increased warmth involving the entire foot   Skin:     Capillary Refill: Capillary refill takes less than 2 seconds.  Neurological:     General: No focal deficit present.     Mental Status: He is alert and oriented to person, place, and time.     Gait: Gait abnormal.     UC Treatments / Results  Labs (all labs ordered are listed, but only abnormal results are displayed) Labs Reviewed - No data to display  EKG   Radiology DG Foot Complete Right  Result Date: 12/19/2021 CLINICAL DATA:  Patient with right foot pain.  No known injury. EXAM: RIGHT FOOT COMPLETE - 3+ VIEW COMPARISON:  None. FINDINGS: Normal anatomic alignment. No evidence for acute fracture or dislocation. Regional soft tissues are unremarkable. IMPRESSION: No acute osseous abnormality. Electronically Signed   By: Annia Beltrew  Davis M.D.   On: 12/19/2021 13:34    Procedures Procedures (including critical care time)  Medications Ordered in UC Medications  ketorolac (TORADOL) 30 MG/ML injection 30 mg (30 mg Intramuscular Given 12/19/21 1423)    Initial Impression / Assessment and Plan / UC Course  I have reviewed the triage vital signs and the nursing notes.  Pertinent labs & imaging results that were available during my care of the patient were reviewed by me and considered in my medical decision making (see chart for details).    Acute right foot pain, suspect gout given appearance of right foot, acute onset of symptoms and negative right foot x-ray. Given duration of symptoms, colchicine less likely to be effective. Will extend prednisone and add indomethacin.  Toradol IM given here in clinic for pain. Patient placed in post-op shoe to reduce pain in foot with weightbearing. Strict return precautions if symptoms do not readily improve. Final Clinical Impressions(s) / UC Diagnoses   Final diagnoses:  Acute foot pain, right  Acute gout of multiple sites, unspecified cause     Discharge Instructions      Treating you for suspected Gout of right foot. Restart prednisone  taper and take as prescribed, start today. Hold indomethacin for 8 hours as you received Toradol in clinic. Your x-ray is negative.     ED Prescriptions     Medication Sig Dispense Auth. Provider   indomethacin (INDOCIN) 50 MG capsule  Take 1 capsule (50 mg total) by mouth 2 (two) times daily with a meal for 10 days. 20 capsule Bing Neighbors, FNP   predniSONE (DELTASONE) 20 MG tablet  (Status: Discontinued) Take 1 tablet (20 mg total) by mouth daily with breakfast. Prednisone 20 mg, in mornings with breakfast as follows: Take 3 pills for 3 days, take 2 pills for 3 days, and take 1 pill for 3 days. 18 tablet Bing Neighbors, FNP   predniSONE (DELTASONE) 20 MG tablet  (Status: Discontinued) Take 1 tablet (20 mg total) by mouth daily with breakfast. Prednisone 20 mg, in mornings with breakfast as follows: Take 3 pills for 3 days, take 1 pills for 3 days, take 1/2 pill for 3 days. 15 tablet Bing Neighbors, FNP   predniSONE (DELTASONE) 20 MG tablet Take 1 tablet (20 mg total) by mouth daily with breakfast. Prednisone 20 mg, in mornings with breakfast as follows: Take 3 pills for 3 days, take 1 pills for 3 days, take 1/2 pill for 3 days. 15 tablet Bing Neighbors, FNP      PDMP not reviewed this encounter.   Bing Neighbors, [place] 12/20/21 [phone]

## 2021-12-19 NOTE — Discharge Instructions (Addendum)
Treating you for suspected Gout of right foot. Restart prednisone taper and take as prescribed, start today. Hold indomethacin for 8 hours as you received Toradol in clinic. Your x-ray is negative.

## 2022-06-23 IMAGING — DX DG FOOT COMPLETE 3+V*R*
3 series · 3 of 3 positions shown · non-contrast
Comparison: None.

CLINICAL DATA: Patient with right foot pain.  No known injury.

EXAM:
RIGHT FOOT COMPLETE - 3+ VIEW

[foot ap]
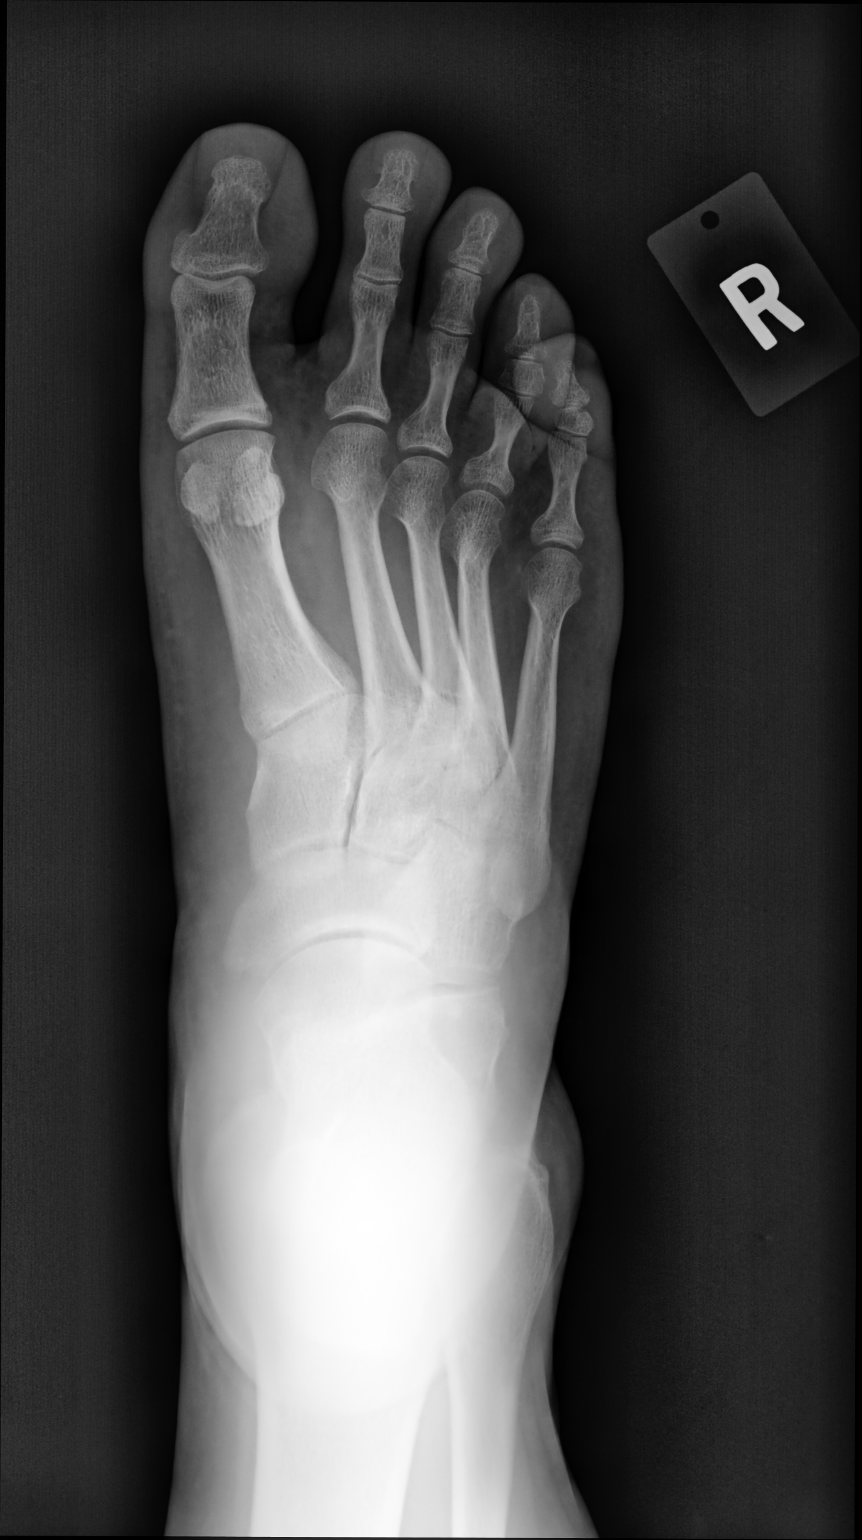

[foot mlo]
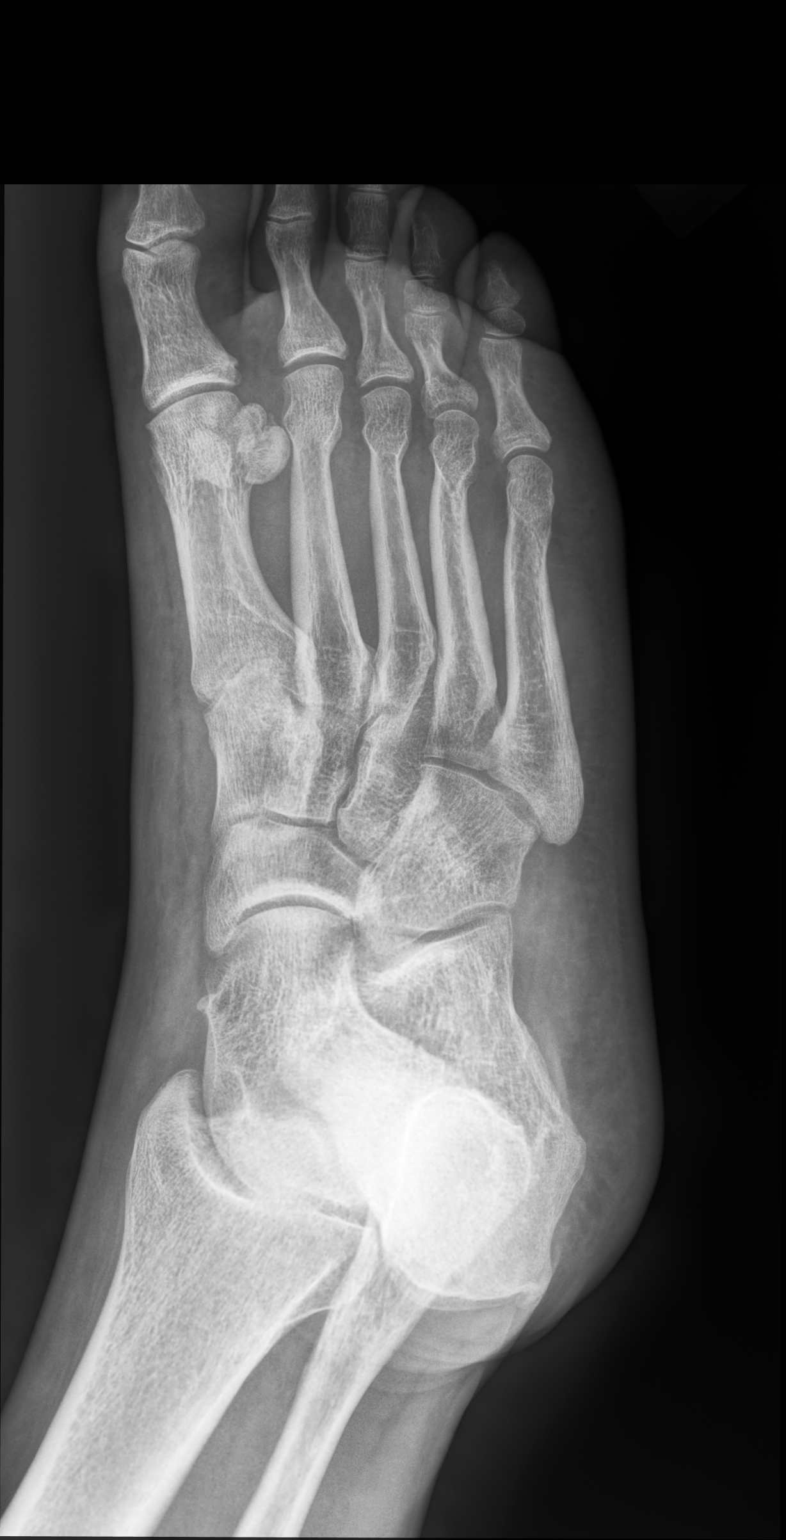

[foot lat]
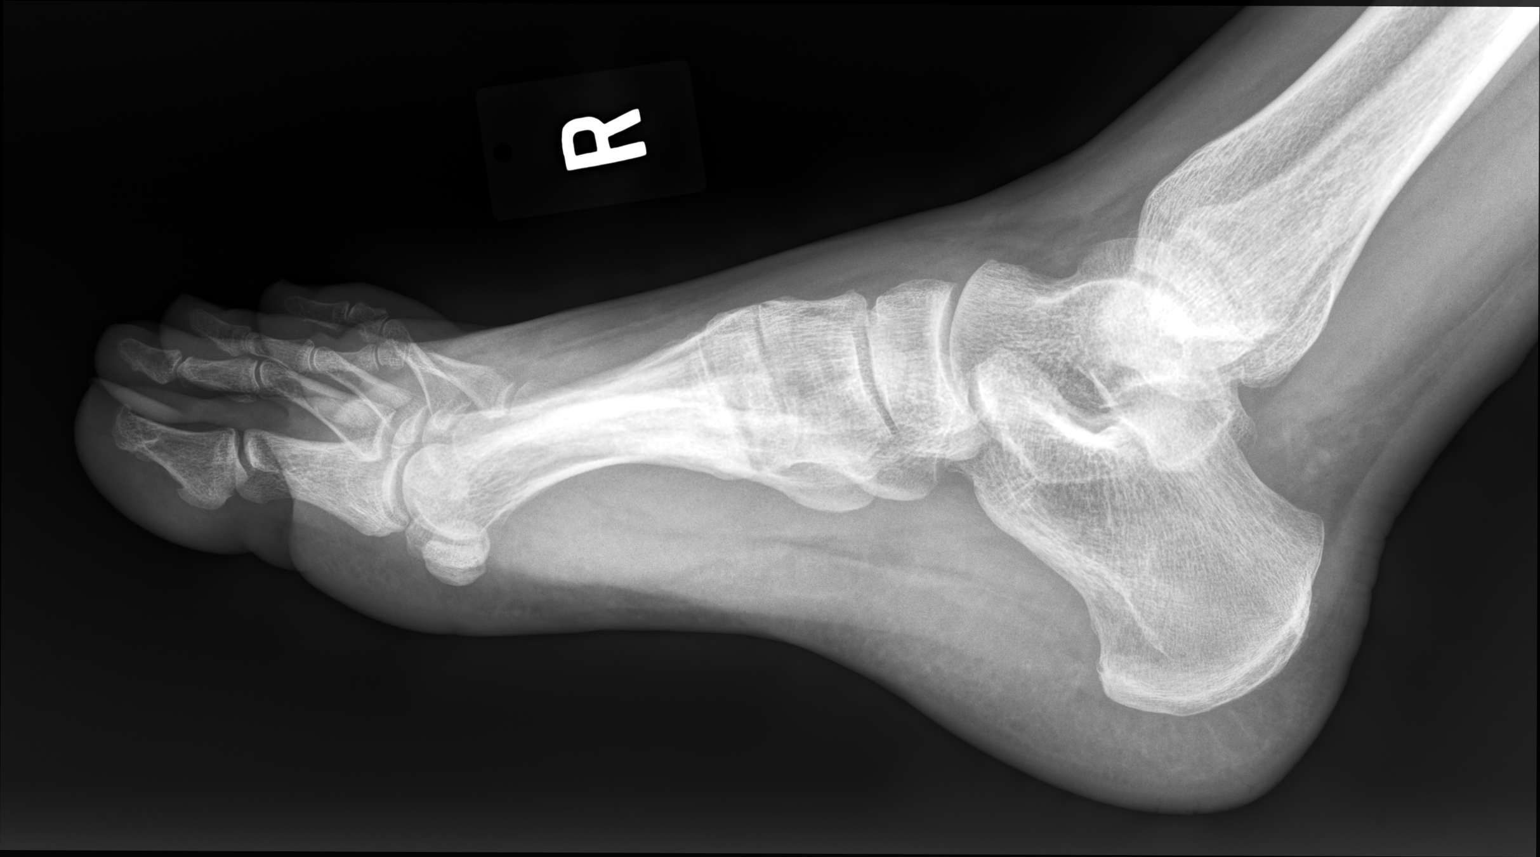

[3 of 3 positions shown; findings below may reference images not displayed]

FINDINGS: Normal anatomic alignment. No evidence for acute fracture or
dislocation. Regional soft tissues are unremarkable.
IMPRESSION: No acute osseous abnormality.

## 2022-09-10 ENCOUNTER — Ambulatory Visit: Payer: 59 | Admitting: Emergency Medicine

## 2022-09-10 ENCOUNTER — Encounter: Payer: Self-pay | Admitting: Emergency Medicine

## 2022-09-10 DIAGNOSIS — G4733 Obstructive sleep apnea (adult) (pediatric): Secondary | ICD-10-CM

## 2022-09-10 NOTE — Progress Notes (Signed)
Subjective:    Patient ID: Austin Bautista, male    DOB: 05-Nov-1986, 36 y.o.   MRN: [id number]  HPI Austin Bautista is a pleasant 36 year old gentleman with a minimal prior tobacco history and little other PMH except for GERD.   He is here today to discuss probable OSA. He falls asleep easily, even unintentionally at work. Decreased energy and a lot of daily fatigue. He works swing shifts and has a lot of sleep / shift variability through work.  His wife has heard snoring and has seen apneas.  He has put on ~100 lbs over 10 yrs.  Epworth = 16  ROV 07/03/21 --36 year old gentleman who started CPAP for significant obstructive sleep apnea.  He is on an AutoSet device 10-20 cm water.  He reports that since he started his wakefulness has improved, he is not snoring, he does not fall asleep unintentionally watching TV.  Better energy level and less napping.  Great clinical response.  A review of his compliance data shows good usage, over 93% of the days.  He actually has used it reliably every day but there is a lag due to shift worker phenomenon.  He has all of his supplies and is able to get these without trouble.  ROV 09/10/22 --36 year old gentleman, former smoker who he has significant obstructive sleep apnea and is on AutoSet CPAP 10-20 cmH2O.  Compliance data available today shows that he is using his device for greater than 4 hours on 83% of the nights, uses it on 100% of the nights between 9/13 and 09/09/2022.  Median pressure is 11 cmH2O.  Good control of his events.  Today he reports that he is doing well. He gets great clinical benefit. He is active. He works 3rd shift, which does perturb sleep pattern.     Review of Systems As per HPi  Past Medical History:  Diagnosis Date   GERD (gastroesophageal reflux disease)      Family History  Problem Relation Age of Onset   Healthy Mother    Healthy Father      Social History   Socioeconomic History   Marital status: Married    Spouse name:  Not on file   Number of children: Not on file   Years of education: Not on file   Highest education level: Not on file  Occupational History   Not on file  Tobacco Use   Smoking status: Never   Smokeless tobacco: Current    Types: Chew  Vaping Use   Vaping Use: Former  Substance and Sexual Activity   Alcohol use: Yes    Comment: Occ   Drug use: Never   Sexual activity: Not on file  Other Topics Concern   Not on file  Social History Narrative   Not on file   Social Determinants of Health   Financial Resource Strain: Not on file  Food Insecurity: Not on file  Transportation Needs: Not on file  Physical Activity: Not on file  Stress: Not on file  Social Connections: Not on file  Intimate Partner Violence: Not on file    Works at [employer].  No inhaled exposures.    Allergies  Allergen Reactions   Minocycline Hives     Outpatient Medications Prior to Visit  Medication Sig Dispense Refill   allopurinol (ZYLOPRIM) 100 MG tablet Take 2 tablets by mouth daily.     pantoprazole (PROTONIX) 40 MG tablet Take 1 tablet by mouth daily.     predniSONE (DELTASONE) 20 MG  tablet Take 1 tablet (20 mg total) by mouth daily with breakfast. Prednisone 20 mg, in mornings with breakfast as follows: Take 3 pills for 3 days, take 1 pills for 3 days, take 1/2 pill for 3 days. 15 tablet 0   No facility-administered medications prior to visit.         Objective:   Physical Exam  Vitals:   09/10/22 0823  BP: 116/74  Pulse: 71  SpO2: 97%  Weight: (!) 326 lb (147.9 kg)  Height: 6\' 3"  (1.905 m)   Gen: Pleasant, well-nourished, in no distress,  normal affect  ENT: No lesions,  mouth clear,  oropharynx clear, no postnasal drip  Neck: No JVD, no stridor  Lungs: No use of accessory muscles, no crackles or wheezing on normal respiration, no wheeze on forced expiration  Cardiovascular: RRR, heart sounds normal, no murmur or gallops, no peripheral edema  Musculoskeletal: No deformities,  no cyanosis or clubbing  Neuro: alert, awake, non focal  Skin: Warm, no lesions or rash     Assessment & Plan:  Obstructive sleep apnea Excellent compliance documented today.  He gets great clinical benefit, does not want to miss it.  Supplies are available and the device is working appropriately.  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  Plan to follow-up annually or sooner if he has any problems.  Congratulations on great compliance with using your CPAP.  Continue wearing it every night and whenever sleeping. Please let know if you need supplies, or are having any difficulty with your device. Follow Dr. [place] in 1 year or sooner if you have any problems.   Austin Coombes, MD, PhD 09/10/2022, 8:44 AM  [facility] [phone] or if no answer before 7:00PM call [phone] For any issues after 7:00PM please call eLink [phone]

## 2022-09-10 NOTE — Assessment & Plan Note (Signed)
Excellent compliance documented today.  He gets great clinical benefit, does not want to miss it.  Supplies are available and the device is working appropriately.  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  Plan to follow-up annually or sooner if he has any problems.  Congratulations on great compliance with using your CPAP.  Continue wearing it every night and whenever sleeping. Please let us know if you need supplies, or are having any difficulty with your device. Follow Dr. Lamonte Sakai in 1 year or sooner if you have any problems.

## 2022-09-10 NOTE — Patient Instructions (Signed)
Congratulations on great compliance with using your CPAP.  Continue wearing it every night and whenever sleeping. Please let us know if you need supplies, or are having any difficulty with your device. Follow Dr. Lamonte Sakai in 1 year or sooner if you have any problems.

## 2023-07-22 ENCOUNTER — Other Ambulatory Visit: Payer: Self-pay | Admitting: Nurse Practitioner

## 2023-07-22 ENCOUNTER — Ambulatory Visit: Payer: Self-pay

## 2023-07-22 DIAGNOSIS — Z0000 Encounter for general adult medical examination without abnormal findings: Secondary | ICD-10-CM

## 2023-12-28 ENCOUNTER — Encounter: Payer: Self-pay | Admitting: Emergency Medicine

## 2023-12-28 ENCOUNTER — Telehealth: Payer: 59 | Admitting: Emergency Medicine

## 2023-12-28 DIAGNOSIS — G4733 Obstructive sleep apnea (adult) (pediatric): Secondary | ICD-10-CM

## 2023-12-28 NOTE — Assessment & Plan Note (Signed)
Obstructive sleep apnea with great control of his symptoms on AutoSet CPAP.  He has great compliance.  His machine is in good repair (37 years old), gets his supplies easily from [employer].  Plan to continue same.  He will call me if he has any problems.  Follow-up in 1 year

## 2023-12-28 NOTE — Progress Notes (Signed)
Virtual Visit via Video Note  I connected with MAXAMILIAN AMADON on 12/28/23 at  4:00 PM EST by a video enabled telemedicine application and verified that I am speaking with the correct person using two identifiers.  Location: Patient: home Provider: office   I discussed the limitations of evaluation and management by telemedicine and the availability of in person appointments. The patient expressed understanding and agreed to proceed.  History of Present Illness: 38 year old gentleman with a history of obstructive sleep apnea.  He is a former smoker but has good functional capacity, no dyspnea, no respiratory symptoms.  He is on AutoSet CPAP 10-20 cm water.  His compliance evaluation shows 100% usage for greater than 4 hours.  He gets great clinical benefit, has good energy, good sleep quality.  Things have improved significantly since he got off of shift work and is now working only during the day.  He has had some weight gain since I last saw him.  His download shows good control of his apneas with a median pressure of 12.5 cm water   Observations/Objective: CPAP compliance download between 12/29 and 12/26/2023 showed 100% usage, always for greater than 4 hours with good control of his apneas.  His average pressure is 12 to 14 cm water with minimal leak.  He reports good clinical benefit  Assessment and Plan: Obstructive sleep apnea Obstructive sleep apnea with great control of his symptoms on AutoSet CPAP.  He has great compliance.  His machine is in good repair (37 years old), gets his supplies easily from [employer].  Plan to continue same.  He will call me if he has any problems.  Follow-up in 1 year   Follow Up Instructions: 1 year   I discussed the assessment and treatment plan with the patient. The patient was provided an opportunity to ask questions and all were answered. The patient agreed with the plan and demonstrated an understanding of the instructions.   The patient was advised to  call back or seek an in-person evaluation if the symptoms worsen or if the condition fails to improve as anticipated.  I provided 15 minutes of non-face-to-face time during this encounter.   Leslye Peer, MD

## 2024-03-16 ENCOUNTER — Ambulatory Visit: Payer: 59 | Admitting: Family

## 2024-03-23 ENCOUNTER — Ambulatory Visit: Payer: 59 | Admitting: Family

## 2024-03-30 ENCOUNTER — Encounter: Payer: Self-pay | Admitting: Family

## 2024-03-30 ENCOUNTER — Ambulatory Visit: Payer: 59 | Admitting: Family

## 2024-03-30 VITALS — BP 124/86 | HR 64 | Temp 98.3°F | Ht 75.0 in | Wt 343.0 lb

## 2024-03-30 DIAGNOSIS — R7303 Prediabetes: Secondary | ICD-10-CM | POA: Diagnosis not present

## 2024-03-30 DIAGNOSIS — G4733 Obstructive sleep apnea (adult) (pediatric): Secondary | ICD-10-CM | POA: Diagnosis not present

## 2024-03-30 DIAGNOSIS — E782 Mixed hyperlipidemia: Secondary | ICD-10-CM | POA: Diagnosis not present

## 2024-03-30 DIAGNOSIS — K2100 Gastro-esophageal reflux disease with esophagitis, without bleeding: Secondary | ICD-10-CM

## 2024-03-30 DIAGNOSIS — K219 Gastro-esophageal reflux disease without esophagitis: Secondary | ICD-10-CM | POA: Insufficient documentation

## 2024-03-30 DIAGNOSIS — M1A0790 Idiopathic chronic gout, unspecified ankle and foot, without tophus (tophi): Secondary | ICD-10-CM | POA: Diagnosis not present

## 2024-03-30 DIAGNOSIS — Z0000 Encounter for general adult medical examination without abnormal findings: Secondary | ICD-10-CM

## 2024-03-30 LAB — COMPREHENSIVE METABOLIC PANEL WITH GFR
ALT: 30 U/L (ref 0–53)
AST: 23 U/L (ref 0–37)
Albumin: 4.5 g/dL (ref 3.5–5.2)
Alkaline Phosphatase: 112 U/L (ref 39–117)
BUN: 13 mg/dL (ref 6–23)
CO2: 29 meq/L (ref 19–32)
Calcium: 9.4 mg/dL (ref 8.4–10.5)
Chloride: 103 meq/L (ref 96–112)
Creatinine, Ser: 0.85 mg/dL (ref 0.40–1.50)
GFR: 111.26 mL/min (ref >60.00)
Glucose, Bld: 106 mg/dL — ABNORMAL HIGH (ref 70–99)
Potassium: 4.4 meq/L (ref 3.5–5.1)
Sodium: 140 meq/L (ref 135–145)
Total Bilirubin: 0.7 mg/dL (ref 0.2–1.2)
Total Protein: 7.4 g/dL (ref 6.0–8.3)

## 2024-03-30 LAB — LIPID PANEL
Cholesterol: 198 mg/dL (ref 0–200)
HDL: 33.4 mg/dL — ABNORMAL LOW (ref >39.00)
LDL Cholesterol: 114 mg/dL — ABNORMAL HIGH (ref 0–99)
NonHDL: 164.22
Total CHOL/HDL Ratio: 6
Triglycerides: 249 mg/dL — ABNORMAL HIGH (ref 0.0–149.0)
VLDL: 49.8 mg/dL — ABNORMAL HIGH (ref 0.0–40.0)

## 2024-03-30 LAB — CBC
HCT: 46.5 % (ref 39.0–52.0)
Hemoglobin: 15.3 g/dL (ref 13.0–17.0)
MCHC: 33 g/dL (ref 30.0–36.0)
MCV: 91.6 fl (ref 78.0–100.0)
Platelets: 195 10*3/uL (ref 150.0–400.0)
RBC: 5.08 Mil/uL (ref 4.22–5.81)
RDW: 14.1 % (ref 11.5–15.5)
WBC: 5.3 10*3/uL (ref 4.0–10.5)

## 2024-03-30 LAB — HEMOGLOBIN A1C: Hgb A1c MFr Bld: 6.4 % (ref 4.6–6.5)

## 2024-03-30 LAB — TSH: TSH: 0.49 u[IU]/mL (ref 0.35–5.50)

## 2024-03-30 LAB — URIC ACID: Uric Acid, Serum: 7.9 mg/dL — ABNORMAL HIGH (ref 4.0–7.8)

## 2024-03-30 NOTE — Assessment & Plan Note (Signed)
 Ordered lipid panel, pending results. Work on low cholesterol diet and exercise as tolerated

## 2024-03-30 NOTE — Assessment & Plan Note (Signed)
 Continue allopurinol

## 2024-03-30 NOTE — Assessment & Plan Note (Signed)
 Pt advised to work on diet and exercise as tolerated The beneficiary does not have any FDA labeled contraindications to the requested agent including pregnancy, lactation, h/o medullary thyroid cancer or multiple endocrine neoplasia type II.   Trial wegovy vs zepbound dependent on insurance coverage.

## 2024-03-30 NOTE — Assessment & Plan Note (Signed)
 Compliant with c-pap

## 2024-03-30 NOTE — Progress Notes (Signed)
 Established Patient Office Visit  Subjective:   Patient ID: Austin Bautista, male    DOB: October 06, 1986  Age: 38 y.o. MRN: [id number]  CC:  Chief Complaint  Patient presents with   New Patient (Initial Visit)    Establish care    HPI: Austin Bautista is a 38 y.o. male presenting on 03/30/2024 for New Patient (Initial Visit) (Establish care) As well as here for annual exam.   Here to establish care.  Pcp prior, [place] , [place]   Gout, typically will get episodes a few times a year. Last time was a few months ago. Typically in feet.   GERD: on pantoprazole 40 mg once daily.   Not utd on eye exams and or dental exams.  Exercise: phsycially active at his job no established exercise routine Diet: spicey foods, likes carbs but working on whole wheat and whole grain  No living will and or advanced directive  Obesity: trying really hard to work on smaller portion sizes, active, take stairs instead of elevator, choosing whole grains and whole wheat.   The beneficiary does not have any FDA labeled contraindications to the requested agent including pregnancy, lactation, h/o medullary thyroid cancer or multiple endocrine neoplasia type II.    No constipation or diarrhea.       ROS: Negative unless specifically indicated above in HPI.   Relevant past medical history reviewed and updated as indicated.   Allergies and medications reviewed and updated.   Current Outpatient Medications:    allopurinol (ZYLOPRIM) 100 MG tablet, Take 2 tablets by mouth daily., Disp: , Rfl:    indomethacin  (INDOCIN ) 50 MG capsule, Take 50 mg by mouth 3 (three) times daily as needed (gout)., Disp: , Rfl:    pantoprazole (PROTONIX) 40 MG tablet, Take 1 tablet by mouth daily., Disp: , Rfl:   Allergies  Allergen Reactions   Minocycline Hives    Objective:   BP 124/86   Pulse 64   Temp 98.3 F (36.8 C)   Ht 6\' 3"  (1.905 m)   Wt (!) 343 lb (155.6 kg)   BMI 42.87 kg/m    Physical Exam Vitals  reviewed.  Constitutional:      General: He is not in acute distress.    Appearance: Normal appearance. He is obese. He is not ill-appearing, toxic-appearing or diaphoretic.  HENT:     Head: Normocephalic.     Right Ear: Tympanic membrane normal.     Left Ear: Tympanic membrane normal.     Nose: Nose normal.     Mouth/Throat:     Mouth: Mucous membranes are moist.  Eyes:     Pupils: Pupils are equal, round, and reactive to light.  Cardiovascular:     Rate and Rhythm: Normal rate and regular rhythm.  Pulmonary:     Effort: Pulmonary effort is normal.     Breath sounds: Normal breath sounds.  Abdominal:     General: Abdomen is flat.     Tenderness: There is no abdominal tenderness.  Musculoskeletal:        General: Normal range of motion.  Skin:    General: Skin is warm.  Neurological:     General: No focal deficit present.     Mental Status: He is alert and oriented to person, place, and time. Mental status is at baseline.  Psychiatric:        Mood and Affect: Mood normal.        Behavior: Behavior normal.  Thought Content: Thought content normal.        Judgment: Judgment normal.     Assessment & Plan:  Moderate obstructive sleep apnea in adult Assessment & Plan: Compliant with cpap    Chronic idiopathic gout involving toe without tophus, unspecified laterality Assessment & Plan: Continue allopurinol   Orders: -     Uric acid  Mixed hyperlipidemia Assessment & Plan: Ordered lipid panel, pending results. Work on low cholesterol diet and exercise as tolerated   Orders: -     Lipid panel  Prediabetes Assessment & Plan: Pt advised of the following: Work on a diabetic diet, try to incorporate exercise at least 20-30 a day for 3 days a week or more.  Ordering A1c pending results.   Orders: -     Hemoglobin A1c -     Comprehensive metabolic panel with GFR  Encounter for general adult medical examination without abnormal findings -     Hemoglobin  A1c -     Uric acid -     Lipid panel -     Comprehensive metabolic panel with GFR -     CBC -     TSH  Obstructive sleep apnea  Gastroesophageal reflux disease with esophagitis without hemorrhage  Morbid obesity (HCC) Assessment & Plan: Pt advised to work on diet and exercise as tolerated The beneficiary does not have any FDA labeled contraindications to the requested agent including pregnancy, lactation, h/o medullary thyroid cancer or multiple endocrine neoplasia type II.   Trial wegovy vs zepbound dependent on insurance coverage.    Orders: -     TSH     Follow up plan: Return in about 6 months (around 09/30/2024) for f/u cholesterol.  Felicita Horns, FNP

## 2024-03-30 NOTE — Assessment & Plan Note (Deleted)
 Compliant with c-pap

## 2024-03-30 NOTE — Assessment & Plan Note (Signed)
Pt advised of the following: Work on a diabetic diet, try to incorporate exercise at least 20-30 a day for 3 days a week or more.  Ordering A1c pending results. 

## 2024-04-02 ENCOUNTER — Ambulatory Visit (INDEPENDENT_AMBULATORY_CARE_PROVIDER_SITE_OTHER)

## 2024-04-02 ENCOUNTER — Other Ambulatory Visit: Payer: Self-pay | Admitting: Family

## 2024-04-02 ENCOUNTER — Encounter: Payer: Self-pay | Admitting: Family

## 2024-04-02 DIAGNOSIS — R7989 Other specified abnormal findings of blood chemistry: Secondary | ICD-10-CM

## 2024-04-02 LAB — T4, FREE: Free T4: 0.81 ng/dL (ref 0.60–1.60)

## 2024-04-02 LAB — T3, FREE: T3, Free: 4.2 pg/mL (ref 2.3–4.2)

## 2024-04-04 ENCOUNTER — Other Ambulatory Visit (HOSPITAL_COMMUNITY): Payer: Self-pay

## 2024-04-04 ENCOUNTER — Other Ambulatory Visit: Payer: Self-pay | Admitting: Family

## 2024-04-04 ENCOUNTER — Telehealth: Payer: Self-pay

## 2024-04-04 ENCOUNTER — Encounter: Payer: Self-pay | Admitting: Family

## 2024-04-04 DIAGNOSIS — G4733 Obstructive sleep apnea (adult) (pediatric): Secondary | ICD-10-CM

## 2024-04-04 DIAGNOSIS — E782 Mixed hyperlipidemia: Secondary | ICD-10-CM

## 2024-04-04 DIAGNOSIS — R7303 Prediabetes: Secondary | ICD-10-CM

## 2024-04-04 MED ORDER — PHENTERMINE HCL 37.5 MG PO CAPS: 37.5000 mg | ORAL_CAPSULE | ORAL | 1 refills | Status: DC

## 2024-04-04 MED ORDER — PHENTERMINE HCL 37.5 MG PO CAPS: ORAL_CAPSULE | ORAL | 0 refills | Status: DC

## 2024-04-04 MED ORDER — ZEPBOUND 7.5 MG/0.5ML ~~LOC~~ SOAJ: 7.5000 mg | SUBCUTANEOUS | 1 refills | Status: DC

## 2024-04-04 MED ORDER — PHENTERMINE HCL 15 MG PO CAPS: ORAL_CAPSULE | ORAL | 0 refills | Status: DC

## 2024-04-04 MED ORDER — ZEPBOUND 5 MG/0.5ML ~~LOC~~ SOAJ: 5.0000 mg | SUBCUTANEOUS | 0 refills | Status: DC

## 2024-04-04 MED ORDER — ZEPBOUND 2.5 MG/0.5ML ~~LOC~~ SOAJ: 2.5000 mg | SUBCUTANEOUS | 0 refills | Status: DC

## 2024-04-04 NOTE — Telephone Encounter (Signed)
 Patient was changed to Zepbound. They did not want to start on phentermine. Just want to make sure that we are working on the [place] for Zepbound. Let me know if you have any questions.

## 2024-04-04 NOTE — Telephone Encounter (Signed)
 Pharmacy Patient Advocate Encounter   Received notification from Physician's Office that prior authorization for Phentermine is required/requested.   Insurance verification completed.   The patient is insured through [facility] MEDICAID .   Per test claim: PA required; PA submitted to above mentioned insurance via CoverMyMeds Key/confirmation #/EOC [facility] Status is pending

## 2024-04-04 NOTE — Telephone Encounter (Signed)
 Pharmacy Patient Advocate Encounter  Received notification from [facility] MEDICAID that Prior Authorization for Phentermine 37.5 caps has been APPROVED from 04/04/24 to 07/25/24. Ran test claim, Copay is $1.86. This test claim was processed through [facility]- copay amounts may vary at other pharmacies due to pharmacy/plan contracts, or as the patient moves through the different stages of their insurance plan.   PA #/Case ID/Reference #: [id number]

## 2024-04-04 NOTE — Telephone Encounter (Signed)
 Can we start a prior auth for zepbound Be sure to mention dx of obesity, prediabetes, hyperlipidemia and sleep apnea.

## 2024-04-05 ENCOUNTER — Other Ambulatory Visit (HOSPITAL_COMMUNITY): Payer: Self-pay

## 2024-04-05 ENCOUNTER — Telehealth: Payer: Self-pay

## 2024-04-05 MED ORDER — PHENTERMINE HCL 37.5 MG PO CAPS: 37.5000 mg | ORAL_CAPSULE | ORAL | 1 refills | Status: DC

## 2024-04-05 MED ORDER — PHENTERMINE HCL 15 MG PO CAPS: ORAL_CAPSULE | ORAL | 0 refills | Status: DC

## 2024-04-05 MED ORDER — PHENTERMINE HCL 37.5 MG PO CAPS: ORAL_CAPSULE | ORAL | 0 refills | Status: DC

## 2024-04-05 NOTE — Telephone Encounter (Signed)
 Pharmacy Patient Advocate Encounter   Received notification from Physician's Office that prior authorization for Zepbound is required/requested.   Insurance verification completed.   The patient is insured through [facility] MEDICAID .   Per test claim: Patient has a plan benefit exclusion

## 2024-04-05 NOTE — Telephone Encounter (Signed)
 Austin Bautista

## 2024-04-05 NOTE — Addendum Note (Signed)
 Addended by: Felicita Horns on: 04/05/2024 02:41 PM   Modules accepted: Orders

## 2024-04-09 NOTE — Telephone Encounter (Signed)
 Pt has been agreeable to phentermine  he is aware of plan exclusion.

## 2024-04-11 ENCOUNTER — Other Ambulatory Visit: Payer: Self-pay

## 2024-04-11 MED ORDER — ALLOPURINOL 100 MG PO TABS: 200.0000 mg | ORAL_TABLET | Freq: Every day | ORAL | 3 refills | Status: AC

## 2024-04-11 MED ORDER — PANTOPRAZOLE SODIUM 40 MG PO TBEC: 40.0000 mg | DELAYED_RELEASE_TABLET | Freq: Every day | ORAL | 3 refills | Status: AC

## 2024-04-12 NOTE — Telephone Encounter (Signed)
 PA request has been resolved in separate encounter, please sign off on rx in this encounter as PA team is unable to resolve RX requests. Thank you

## 2024-04-12 NOTE — Telephone Encounter (Signed)
 Pt and office made aware of plan exclusion in separate encounter. Please sign off on rx in this encounter as PA team is unable to resolve RX requests. Thank you

## 2024-06-13 ENCOUNTER — Encounter: Payer: Self-pay | Admitting: Family

## 2024-06-13 DIAGNOSIS — G4733 Obstructive sleep apnea (adult) (pediatric): Secondary | ICD-10-CM

## 2024-06-13 DIAGNOSIS — R7303 Prediabetes: Secondary | ICD-10-CM

## 2024-06-13 DIAGNOSIS — E782 Mixed hyperlipidemia: Secondary | ICD-10-CM

## 2024-06-13 MED ORDER — PHENTERMINE HCL 37.5 MG PO CAPS: 37.5000 mg | ORAL_CAPSULE | ORAL | 0 refills | Status: AC

## 2024-07-11 MED ORDER — CONTRAVE 8-90 MG PO TB12: ORAL_TABLET | ORAL | 3 refills | Status: DC

## 2024-07-11 NOTE — Addendum Note (Signed)
 Addended by: CORWIN ANTU on: 07/11/2024 12:38 PM   Modules accepted: Orders

## 2024-07-17 MED ORDER — CONTRAVE 8-90 MG PO TB12: ORAL_TABLET | ORAL | 3 refills | Status: AC

## 2024-07-17 NOTE — Addendum Note (Signed)
 Addended by: CORWIN ANTU on: 07/17/2024 02:23 PM   Modules accepted: Orders

## 2024-07-19 ENCOUNTER — Other Ambulatory Visit (HOSPITAL_COMMUNITY): Payer: Self-pay

## 2024-07-19 ENCOUNTER — Telehealth: Payer: Self-pay

## 2024-07-23 ENCOUNTER — Other Ambulatory Visit (HOSPITAL_COMMUNITY): Payer: Self-pay

## 2024-07-23 NOTE — Telephone Encounter (Signed)
Pt informed

## 2024-07-23 NOTE — Telephone Encounter (Signed)
 Pharmacy Patient Advocate Encounter  Received notification from [employer] that Prior Authorization for Contrave  8-90 has been APPROVED from 07/17/24 to 11/16/24. Unable to obtain price due to refill too soon rejection, last fill date 07/19/24 next available fill date10/18/25   PA #/Case ID/Reference #: # [id number]

## 2024-12-18 ENCOUNTER — Encounter: Payer: Self-pay | Admitting: Family

## 2025-01-09 ENCOUNTER — Ambulatory Visit: Admitting: Emergency Medicine

## 2025-04-05 ENCOUNTER — Ambulatory Visit: Admitting: Family
